# Patient Record
Sex: Male | Born: 1960 | Race: Black or African American | Hispanic: No | Marital: Single | Smoking: Current every day smoker
Health system: Southern US, Community
[De-identification: ages and names within clinical notes are randomized; demographics above are authoritative.]

## PROBLEM LIST (undated history)

## (undated) DIAGNOSIS — H409 Unspecified glaucoma: Secondary | ICD-10-CM

## (undated) DIAGNOSIS — I10 Essential (primary) hypertension: Secondary | ICD-10-CM

## (undated) DIAGNOSIS — E119 Type 2 diabetes mellitus without complications: Secondary | ICD-10-CM

---

## 2013-04-03 ENCOUNTER — Emergency Department (HOSPITAL_COMMUNITY): Payer: Medicaid - Out of State

## 2013-04-03 ENCOUNTER — Emergency Department (HOSPITAL_COMMUNITY)
Admission: EM | Admit: 2013-04-03 | Discharge: 2013-04-03 | Disposition: A | Discharge status: Home / Self Care | Payer: Medicaid - Out of State | Attending: Emergency Medicine | Admitting: Emergency Medicine

## 2013-04-03 ENCOUNTER — Encounter (HOSPITAL_COMMUNITY): Payer: Self-pay | Admitting: Emergency Medicine

## 2013-04-03 DIAGNOSIS — Y939 Activity, unspecified: Secondary | ICD-10-CM | POA: Insufficient documentation

## 2013-04-03 DIAGNOSIS — Z79899 Other long term (current) drug therapy: Secondary | ICD-10-CM | POA: Insufficient documentation

## 2013-04-03 DIAGNOSIS — Y929 Unspecified place or not applicable: Secondary | ICD-10-CM | POA: Insufficient documentation

## 2013-04-03 DIAGNOSIS — IMO0002 Reserved for concepts with insufficient information to code with codable children: Secondary | ICD-10-CM | POA: Insufficient documentation

## 2013-04-03 DIAGNOSIS — H409 Unspecified glaucoma: Secondary | ICD-10-CM | POA: Insufficient documentation

## 2013-04-03 DIAGNOSIS — E119 Type 2 diabetes mellitus without complications: Secondary | ICD-10-CM | POA: Insufficient documentation

## 2013-04-03 DIAGNOSIS — X58XXXA Exposure to other specified factors, initial encounter: Secondary | ICD-10-CM | POA: Insufficient documentation

## 2013-04-03 DIAGNOSIS — R739 Hyperglycemia, unspecified: Secondary | ICD-10-CM

## 2013-04-03 DIAGNOSIS — Z91199 Patient's noncompliance with other medical treatment and regimen due to unspecified reason: Secondary | ICD-10-CM | POA: Insufficient documentation

## 2013-04-03 DIAGNOSIS — R21 Rash and other nonspecific skin eruption: Secondary | ICD-10-CM | POA: Insufficient documentation

## 2013-04-03 DIAGNOSIS — S46912A Strain of unspecified muscle, fascia and tendon at shoulder and upper arm level, left arm, initial encounter: Secondary | ICD-10-CM

## 2013-04-03 DIAGNOSIS — Z9119 Patient's noncompliance with other medical treatment and regimen: Secondary | ICD-10-CM | POA: Insufficient documentation

## 2013-04-03 DIAGNOSIS — I10 Essential (primary) hypertension: Secondary | ICD-10-CM | POA: Insufficient documentation

## 2013-04-03 DIAGNOSIS — F17200 Nicotine dependence, unspecified, uncomplicated: Secondary | ICD-10-CM | POA: Insufficient documentation

## 2013-04-03 HISTORY — DX: Type 2 diabetes mellitus without complications: E11.9

## 2013-04-03 HISTORY — DX: Unspecified glaucoma: H40.9

## 2013-04-03 HISTORY — DX: Essential (primary) hypertension: I10

## 2013-04-03 LAB — BASIC METABOLIC PANEL
BUN: 15 mg/dL (ref 6–23)
Calcium: 8.7 mg/dL (ref 8.4–10.5)
Chloride: 101 mEq/L (ref 96–112)
Creatinine, Ser: 0.8 mg/dL (ref 0.50–1.35)
GFR calc Af Amer: >90 mL/min (ref >90)
GFR calc non Af Amer: >90 mL/min (ref >90)
Glucose, Bld: 402 mg/dL — ABNORMAL HIGH (ref 70–99)
Potassium: 4.2 mEq/L (ref 3.5–5.1)
Sodium: 134 mEq/L — ABNORMAL LOW (ref 135–145)

## 2013-04-03 LAB — URINALYSIS, ROUTINE W REFLEX MICROSCOPIC
Bilirubin Urine: NEGATIVE
Hgb urine dipstick: NEGATIVE
Ketones, ur: 15 mg/dL — AB
Leukocytes, UA: NEGATIVE
Protein, ur: NEGATIVE mg/dL
Specific Gravity, Urine: 1.04 — ABNORMAL HIGH (ref 1.005–1.030)
pH: 6 (ref 5.0–8.0)

## 2013-04-03 LAB — URINE MICROSCOPIC-ADD ON

## 2013-04-03 LAB — CBC
HCT: 44.5 % (ref 39.0–52.0)
Hemoglobin: 15.8 g/dL (ref 13.0–17.0)
MCHC: 35.5 g/dL (ref 30.0–36.0)
MCV: 90.4 fL (ref 78.0–100.0)
Platelets: 150 10*3/uL (ref 150–400)
RDW: 12.3 % (ref 11.5–15.5)

## 2013-04-03 LAB — GLUCOSE, CAPILLARY
Glucose-Capillary: 382 mg/dL — ABNORMAL HIGH (ref 70–99)
Glucose-Capillary: 218 mg/dL — ABNORMAL HIGH (ref 70–99)

## 2013-04-03 MED ORDER — SODIUM CHLORIDE 0.9 % IV BOLUS (SEPSIS)
1000.0000 mL | Freq: Once | INTRAVENOUS | Status: AC
  Administered 2013-04-03: 1000 mL via INTRAVENOUS

## 2013-04-03 MED ORDER — METFORMIN HCL 500 MG PO TABS: 500.0000 mg | ORAL_TABLET | Freq: Two times a day (BID) | ORAL | Status: AC

## 2013-04-03 MED ORDER — OXYCODONE-ACETAMINOPHEN 5-325 MG PO TABS
1.0000 | ORAL_TABLET | Freq: Once | ORAL | Status: AC
  Administered 2013-04-03: 1 via ORAL
  Filled 2013-04-03: qty 1

## 2013-04-03 MED ORDER — OXYCODONE-ACETAMINOPHEN 5-325 MG PO TABS
2.0000 | ORAL_TABLET | Freq: Once | ORAL | Status: AC
  Administered 2013-04-03: 1 via ORAL
  Filled 2013-04-03 (×2): qty 2

## 2013-04-03 NOTE — ED Notes (Signed)
Pt reports he has been having pain in his L shoulder, hurts to move it, but did not injure it. Then he noticed a few days ago that he had a painful swollen reddened bump to L side of neck. States the area itches and hurts. He tried calamine lotion with no relief. He is from out of town and ran out of meds 2 months ago and his daughter was supposed to mail him his medicaid card and she never did so he has been unable to see a doctor. He is diabetic.
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
## 2013-04-03 NOTE — ED Notes (Signed)
Dropped 1 Percocet on the floor. EDPA made aware.

## 2013-04-11 NOTE — ED Provider Notes (Signed)
Medical screening examination/treatment/procedure(s) were performed by non-physician practitioner and as supervising physician I was immediately available for consultation/collaboration.  EKG Interpretation   None        Bexlee Bergdoll R. Arjuna Doeden, MD 04/11/13 2111 

## 2013-06-03 ENCOUNTER — Telehealth: Payer: Self-pay

## 2013-06-03 NOTE — Telephone Encounter (Signed)
Physician medical release form faxed to Dr.Lanier,DDS.fax [phone] Per Dr.Smith pt on plavix due to recent stent and apixiban for afib Cannot interrupt until July 2015

## 2014-12-01 IMAGING — CR DG SHOULDER 2+V*L*
4 series · 4 of 4 positions shown · non-contrast
Comparison: None.

CLINICAL DATA: Left shoulder pain x2 months

EXAM:
LEFT SHOULDER - 2+ VIEW

[w shoulder external left]
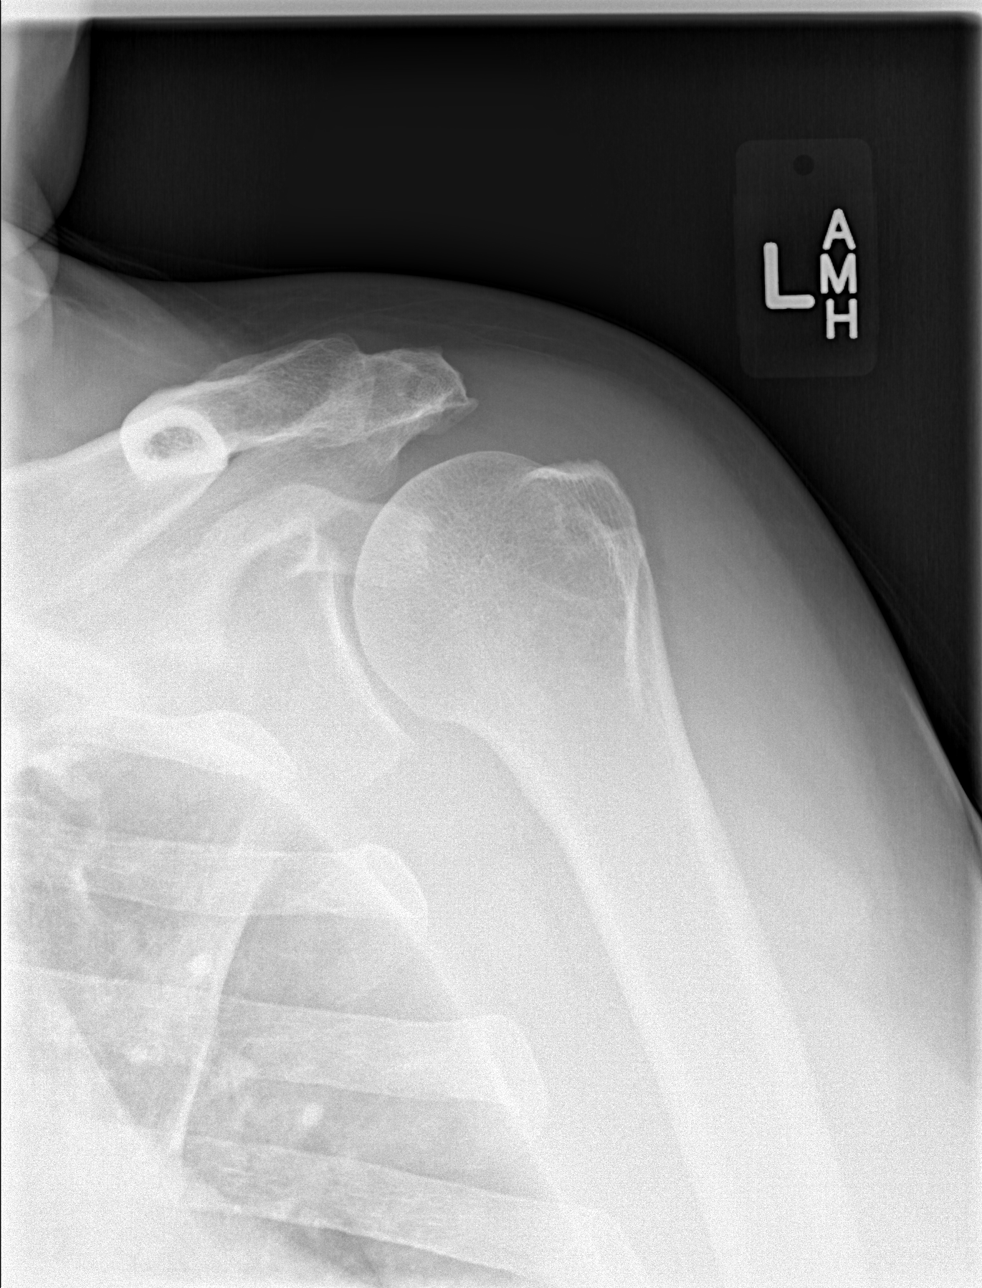

[w shoulder y-view left (1 of 2)]
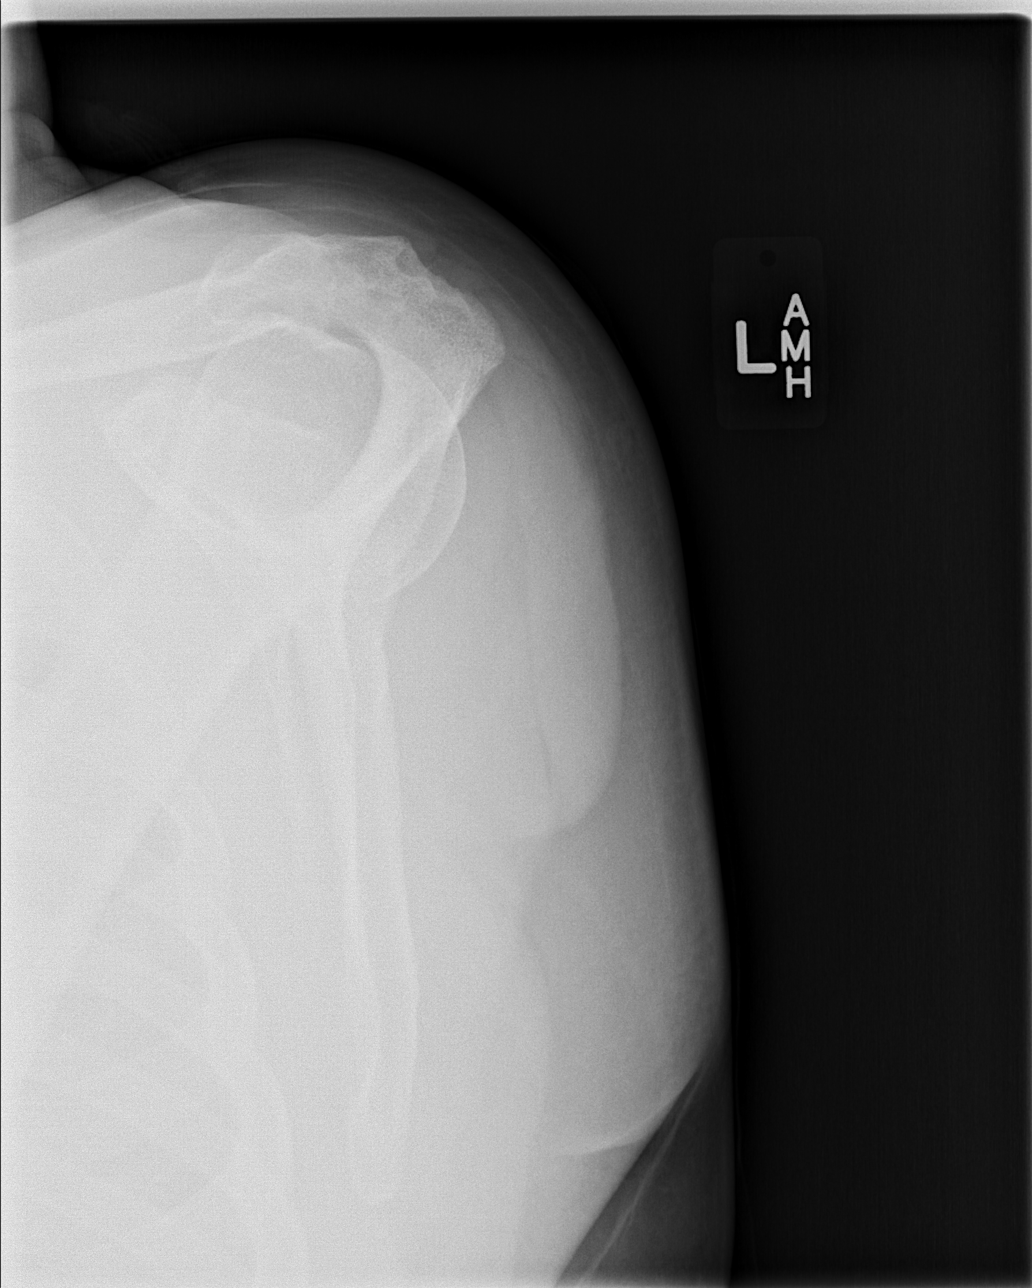

[w shoulder y-view left (2 of 2)]
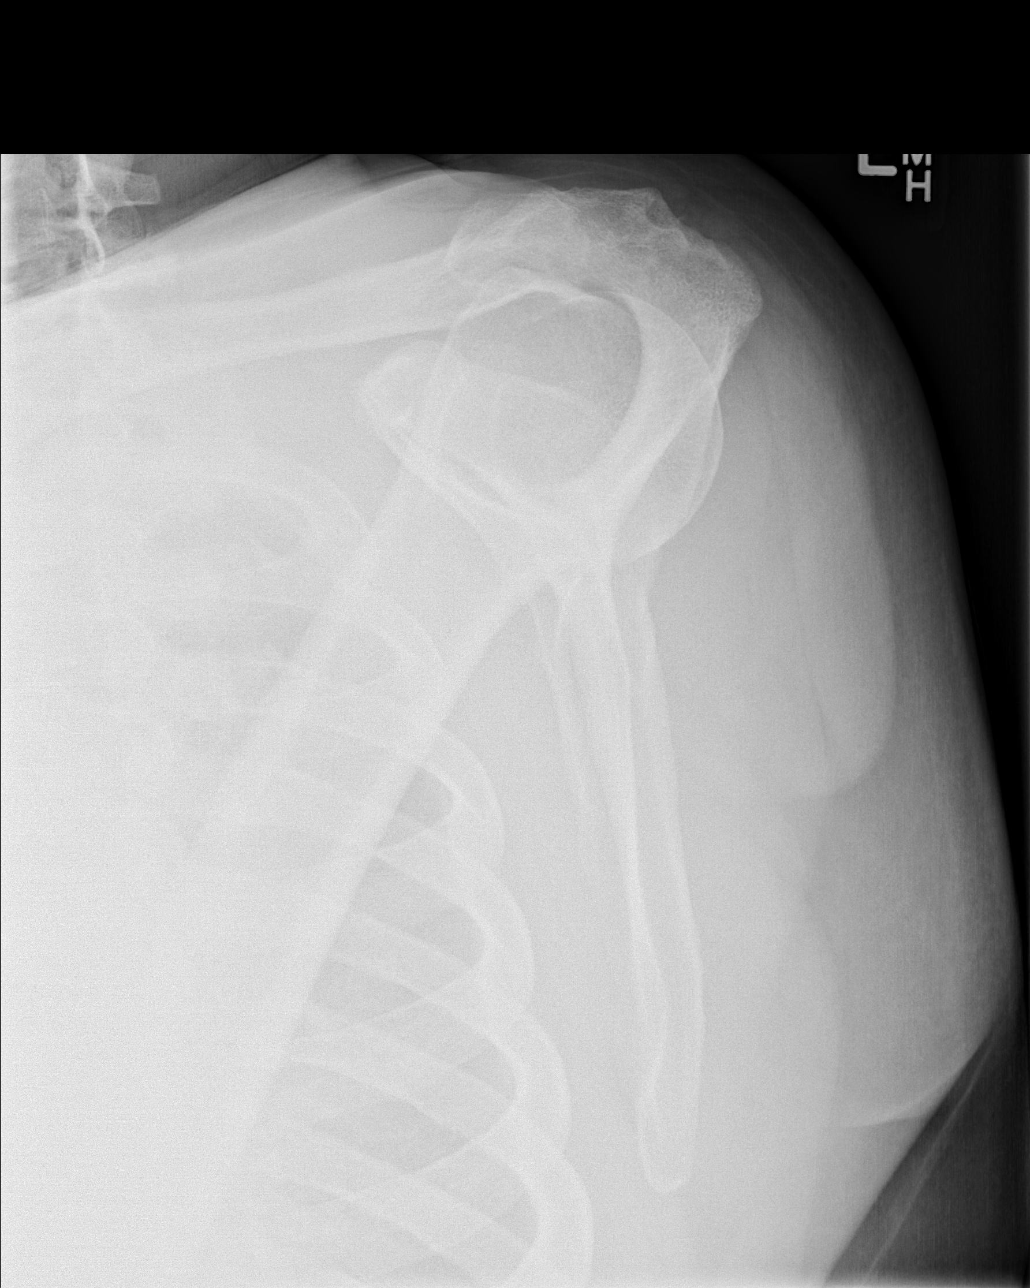

[x shoulder axillary left]
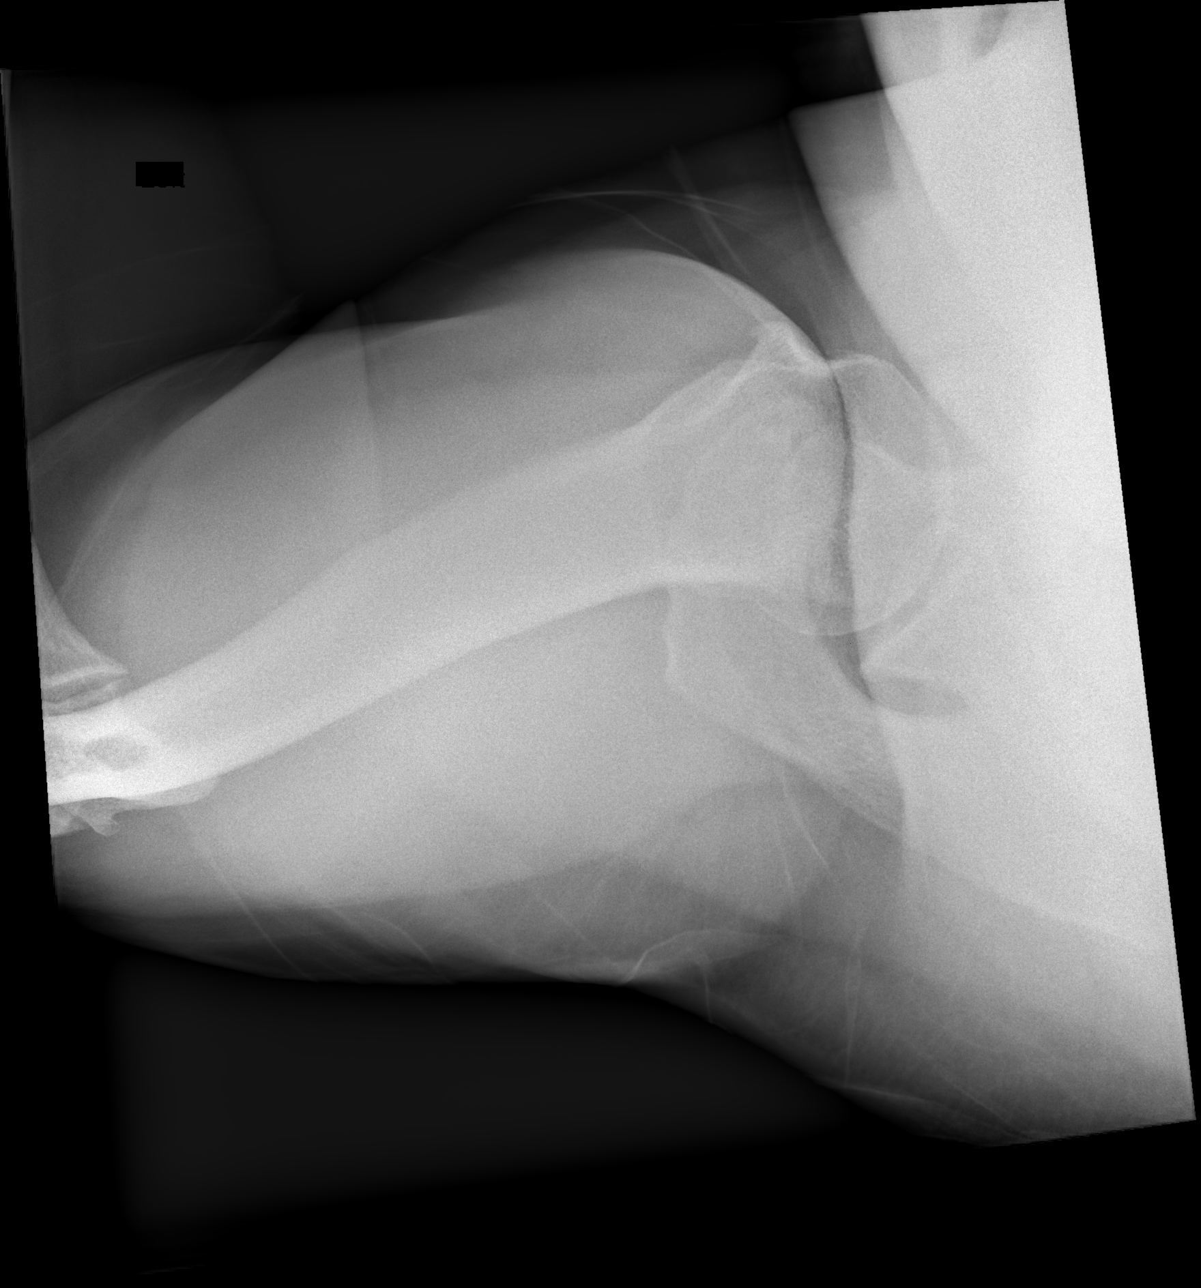

[4 of 4 positions shown; findings below may reference images not displayed]

FINDINGS: No fracture or dislocation is seen.

Mild degenerative changes of the acromioclavicular joint.

The visualized soft tissues are unremarkable.

Visualized left lung is clear.
IMPRESSION: No fracture or dislocation is seen.

Mild degenerative changes of the acromioclavicular joint.
